# Patient Record
Sex: Female | Born: 1980 | Hispanic: Yes | Marital: Married | Smoking: Never smoker
Health system: Southern US, Community
[De-identification: ages and names within clinical notes are randomized; demographics above are authoritative.]

## PROBLEM LIST (undated history)

## (undated) DIAGNOSIS — E669 Obesity, unspecified: Secondary | ICD-10-CM

## (undated) HISTORY — DX: Obesity, unspecified: E66.9

---

## 2011-11-21 ENCOUNTER — Ambulatory Visit: Payer: Self-pay | Admitting: Internal Medicine

## 2016-11-30 DIAGNOSIS — Z01419 Encounter for gynecological examination (general) (routine) without abnormal findings: Secondary | ICD-10-CM | POA: Diagnosis not present

## 2016-11-30 DIAGNOSIS — Z0000 Encounter for general adult medical examination without abnormal findings: Secondary | ICD-10-CM | POA: Diagnosis not present

## 2016-12-05 DIAGNOSIS — Z0000 Encounter for general adult medical examination without abnormal findings: Secondary | ICD-10-CM | POA: Diagnosis not present

## 2017-03-15 ENCOUNTER — Encounter: Payer: Self-pay | Admitting: Family Medicine

## 2017-03-15 ENCOUNTER — Ambulatory Visit (INDEPENDENT_AMBULATORY_CARE_PROVIDER_SITE_OTHER): Payer: BLUE CROSS/BLUE SHIELD | Admitting: Family Medicine

## 2017-03-15 VITALS — BP 117/72 | HR 77 | Temp 98.7°F | Ht 63.5 in | Wt 178.0 lb

## 2017-03-15 DIAGNOSIS — R05 Cough: Secondary | ICD-10-CM

## 2017-03-15 DIAGNOSIS — R600 Localized edema: Secondary | ICD-10-CM | POA: Diagnosis not present

## 2017-03-15 DIAGNOSIS — R059 Cough, unspecified: Secondary | ICD-10-CM

## 2017-03-15 DIAGNOSIS — Z7689 Persons encountering health services in other specified circumstances: Secondary | ICD-10-CM | POA: Diagnosis not present

## 2017-03-15 MED ORDER — BENZONATATE 100 MG PO CAPS: 200.0000 mg | ORAL_CAPSULE | Freq: Three times a day (TID) | ORAL | 0 refills | Status: AC | PRN

## 2017-03-15 NOTE — Progress Notes (Signed)
BP 117/72   Pulse 77   Temp 98.7 F (37.1 C)   Ht 5' 3.5" (1.613 m)   Wt 178 lb (80.7 kg)   LMP 03/14/2017 (Exact Date)   SpO2 98%   BMI 31.04 kg/m    Subjective:    Patient ID: Rhonda Collier, female    DOB: 07-06-1981, 36 y.o.   MRN: [id number]  HPI: Rhonda Collier is a 36 y.o. female  Chief Complaint  Patient presents with  . Establish Care  . Cough    dry cough lingers after she has a cold. sometimes lasts a month. OTC meds help sometimes.   . Edema    some swelling in her ankles occasionally, stands at work [employer].   Patient presents today to [facility]. No known medical conditions, very healthy overall. UTD on annual exam, saw GYN 11/2016 for this. Not currently on any prescription medications.   Having a dry intermittent cough that seems to linger a few weeks to a month at a time and then dissipate. Taking some robitussin and honey tea which helps. Works at a [employer] and wondering if she is allergic to something there. Denies rhinorrhea, post-nasal drip, sore throat, wheezing, SOB.   Also c/o b/l LE edema toward the end of the day. On her feet all day long at work. Swelling improves when she props her feet up at the end of the day. The past 4-5 months she's noticed this becoming slightly worse and having a little tingling in her feet. Does note some weight gain the past year or so. Starting to work out some again recently. Has not tried compression stockings. Denies redness, calf tenderness.   Past Medical History:  Diagnosis Date  . Obesity    Social History   Social History  . Marital status: Married    Spouse name: N/A  . Number of children: N/A  . Years of education: N/A   Occupational History  . Not on file.   Social History Main Topics  . Smoking status: Never Smoker  . Smokeless tobacco: Never Used  . Alcohol use No  . Drug use: No  . Sexual activity: Not on file   Other Topics Concern  . Not on file   Social History Narrative  . No narrative on  file    Relevant past medical, surgical, family and social history reviewed and updated as indicated. Interim medical history since our last visit reviewed. Allergies and medications reviewed and updated.  Review of Systems  Constitutional: Negative.   HENT: Negative.   Eyes: Negative.   Respiratory: Positive for cough.   Cardiovascular: Positive for leg swelling.  Musculoskeletal: Negative.   Skin: Negative.   Neurological: Negative.   Psychiatric/Behavioral: Negative.     Per HPI unless specifically indicated above     Objective:    BP 117/72   Pulse 77   Temp 98.7 F (37.1 C)   Ht 5' 3.5" (1.613 m)   Wt 178 lb (80.7 kg)   LMP 03/14/2017 (Exact Date)   SpO2 98%   BMI 31.04 kg/m   Wt Readings from Last 3 Encounters:  03/15/17 178 lb (80.7 kg)    Physical Exam  Constitutional: She is oriented to person, place, and time. She appears well-developed and well-nourished. No distress.  HENT:  Head: Atraumatic.  Right Ear: External ear normal.  Left Ear: External ear normal.  Nose: Nose normal.  Mouth/Throat: Oropharynx is clear and moist.  Eyes: Conjunctivae are normal. Pupils are  equal, round, and reactive to light.  Neck: Normal range of motion. Neck supple.  Cardiovascular: Normal rate and normal heart sounds.   Pulmonary/Chest: Effort normal and breath sounds normal. No respiratory distress. She has no wheezes. She has no rales.  Musculoskeletal: Normal range of motion. She exhibits edema (trace edema b/l LEs) and tenderness (LEs nontender to palpation).  - Homan's sign, no calf redness or localized edema Edema equal b/l  Neurological: She is alert and oriented to person, place, and time.  Skin: Skin is warm and dry.  Psychiatric: She has a normal mood and affect. Her behavior is normal.  Nursing note and vitals reviewed.   No results found for this or any previous visit.    Assessment & Plan:   Problem List Items Addressed This Visit    None    Visit  Diagnoses    Encounter to establish care    -  Primary   UTD on physical exam, done at GYN with labs. Pt to return as needed unless she chooses to have PE done here - will be due in Dec. She will call if so.    Cough       Suspect irritant related. Recommended trying daily zyrtec for a few weeks, if no improvement try wearing mask at work for a few days and monitor for improvement   Bilateral leg edema       Benign exam. Discussed compression stockings, increased exercise, keep feet elevated at rest. Return precautions given    Tessalon perles sent for prn use for the dry cough. Will consider CXR if persisting.    Follow up plan: Return if symptoms worsen or fail to improve.

## 2017-03-15 NOTE — Patient Instructions (Signed)
Follow up as needed

## 2018-02-28 DIAGNOSIS — N898 Other specified noninflammatory disorders of vagina: Secondary | ICD-10-CM | POA: Diagnosis not present

## 2018-02-28 DIAGNOSIS — R300 Dysuria: Secondary | ICD-10-CM | POA: Diagnosis not present

## 2018-02-28 DIAGNOSIS — Z01419 Encounter for gynecological examination (general) (routine) without abnormal findings: Secondary | ICD-10-CM | POA: Diagnosis not present

## 2018-04-29 DIAGNOSIS — Z01419 Encounter for gynecological examination (general) (routine) without abnormal findings: Secondary | ICD-10-CM | POA: Diagnosis not present

## 2018-04-29 DIAGNOSIS — B372 Candidiasis of skin and nail: Secondary | ICD-10-CM | POA: Diagnosis not present

## 2021-04-28 ENCOUNTER — Other Ambulatory Visit: Payer: Self-pay | Admitting: Internal Medicine

## 2021-04-28 DIAGNOSIS — Z1231 Encounter for screening mammogram for malignant neoplasm of breast: Secondary | ICD-10-CM

## 2021-07-07 ENCOUNTER — Other Ambulatory Visit: Payer: Self-pay

## 2021-07-07 ENCOUNTER — Ambulatory Visit
Admission: RE | Admit: 2021-07-07 | Discharge: 2021-07-07 | Disposition: A | Discharge status: Home / Self Care | Payer: BC Managed Care – PPO | Source: Ambulatory Visit | Attending: Internal Medicine | Admitting: Internal Medicine

## 2021-07-07 DIAGNOSIS — Z1231 Encounter for screening mammogram for malignant neoplasm of breast: Secondary | ICD-10-CM | POA: Insufficient documentation

## 2022-01-20 ENCOUNTER — Other Ambulatory Visit: Payer: Self-pay | Admitting: Internal Medicine

## 2022-01-20 DIAGNOSIS — R519 Headache, unspecified: Secondary | ICD-10-CM

## 2022-01-27 ENCOUNTER — Ambulatory Visit
Admission: RE | Admit: 2022-01-27 | Discharge: 2022-01-27 | Disposition: A | Discharge status: Home / Self Care | Payer: BC Managed Care – PPO | Source: Ambulatory Visit | Attending: Internal Medicine | Admitting: Internal Medicine

## 2022-01-27 ENCOUNTER — Other Ambulatory Visit: Payer: Self-pay

## 2022-01-27 DIAGNOSIS — R519 Headache, unspecified: Secondary | ICD-10-CM | POA: Diagnosis present

## 2022-01-27 MED ORDER — GADOBUTROL 1 MMOL/ML IV SOLN
7.5000 mL | Freq: Once | INTRAVENOUS | Status: AC | PRN
  Administered 2022-01-27: 7.5 mL via INTRAVENOUS

## 2022-04-12 ENCOUNTER — Ambulatory Visit: Payer: BC Managed Care – PPO

## 2022-11-13 IMAGING — MR MR HEAD WO/W CM
13 series · 48 of 48 positions shown · IV contrast (gadavist)
Comparison: None.

CLINICAL DATA: Chronic headaches.  Dizziness.

EXAM:
MRI HEAD WITHOUT AND WITH CONTRAST
TECHNIQUE: Multiplanar, multiecho pulse sequences of the brain and surrounding
structures were obtained without and with intravenous contrast.
CONTRAST:  7.5mL GADAVIST GADOBUTROL 1 MMOL/ML IV SOLN

[Series 5: ax dwi_tracew · axial · 3.0mm · 0.65mm/px · z∈[-117,+37]mm · 4 of 48 slices shown]
[im 1/48]
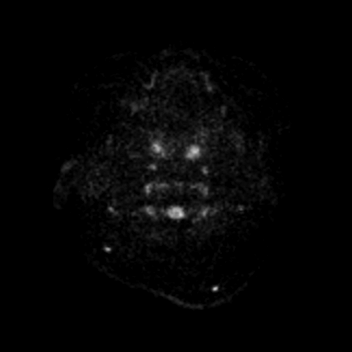
[im 16/48]
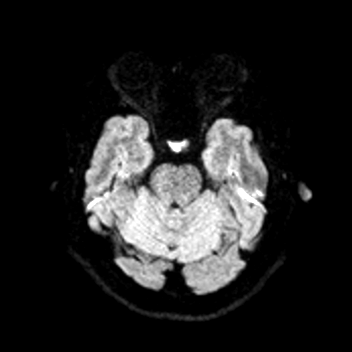
[im 32/48]
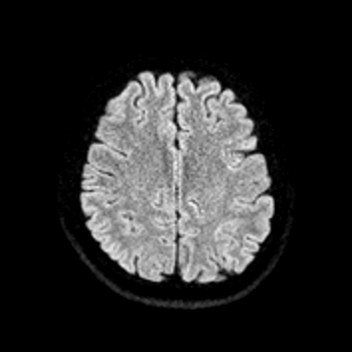
[im 48/48]
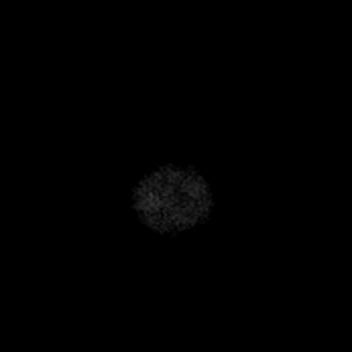

[Series 6: ax dwi_adc · axial · 3.0mm · 0.65mm/px · z∈[-117,+28]mm · 4 of 44 slices shown]
[im 1/44]
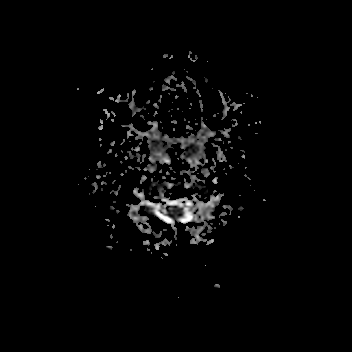
[im 15/44]
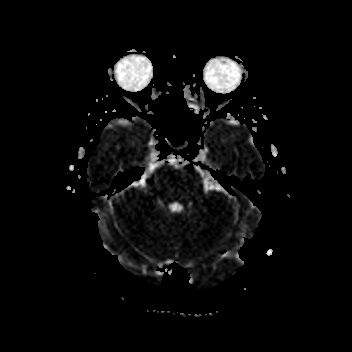
[im 29/44]
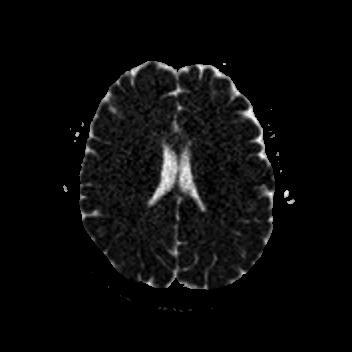
[im 44/44]
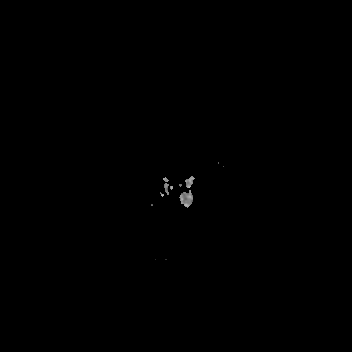

[Series 7: cor dwi_tracew · coronal · 5.0mm · 0.60mm/px · 2 of 36 slices shown]
[im 1/36]
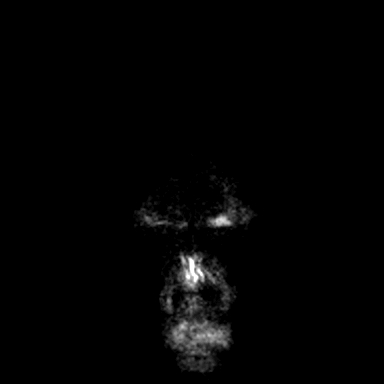
[im 36/36]
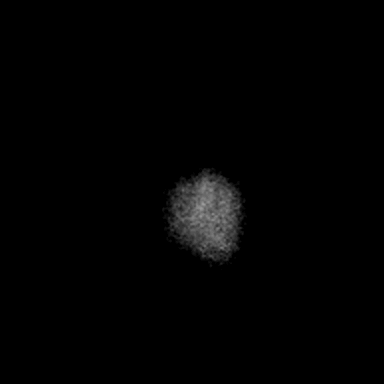

[Series 8: cor dwi_adc · coronal · 5.0mm · 0.60mm/px · 2 of 34 slices shown]
[im 1/34]
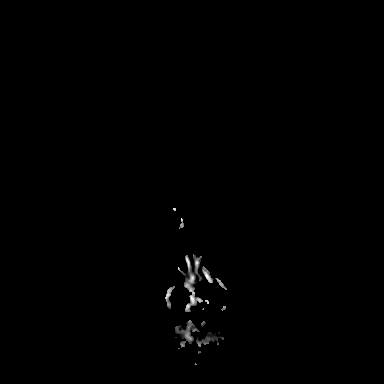
[im 34/34]
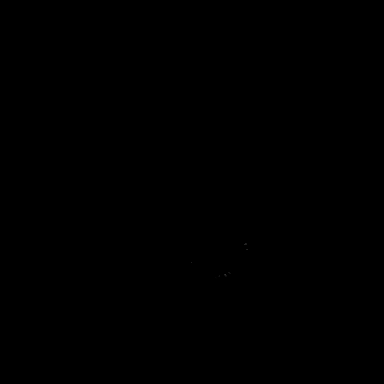

[Series 9: T1 · sagittal · 5.0mm · 0.62mm/px · 1 of 23 slices shown (1 of 2)]
[im 1/23]
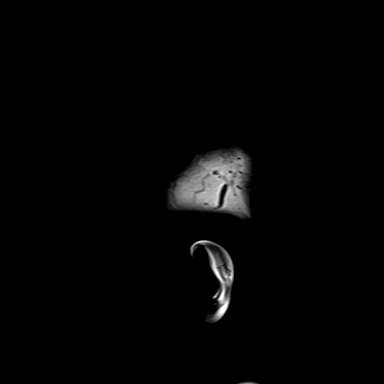

[Series 10: T2 · axial · 5.0mm · 0.53mm/px · z∈[-118,+38]mm · 2 of 27 slices shown]
[im 1/27]
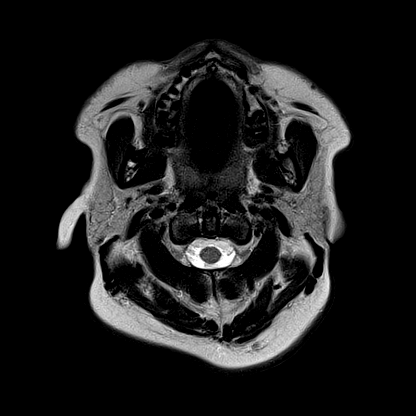
[im 27/27]
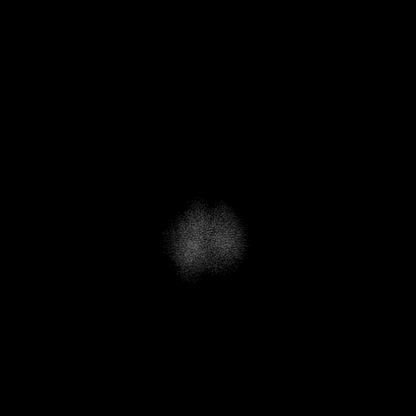

[Series 12: pha_images · axial · 3.0mm · 0.90mm/px · z∈[-114,+33]mm · 3 of 50 slices shown]
[im 1/50]
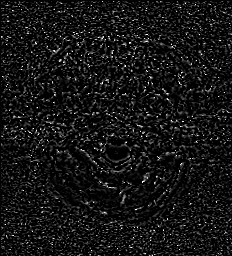
[im 25/50]
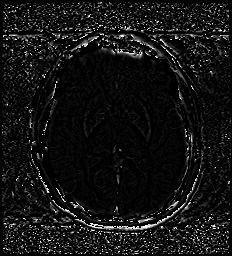
[im 50/50]
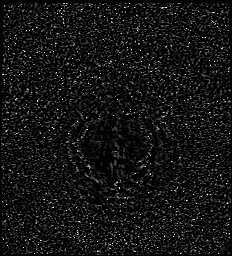

[Series 13: swi_images · axial · 3.0mm · 0.90mm/px · z∈[-117,+36]mm · 3 of 52 slices shown]
[im 1/52]
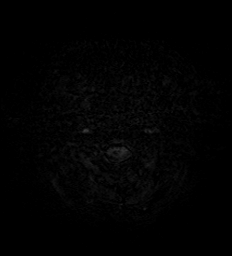
[im 26/52]
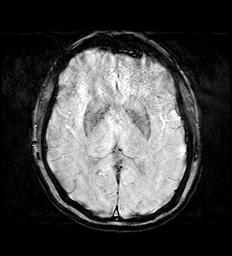
[im 52/52]
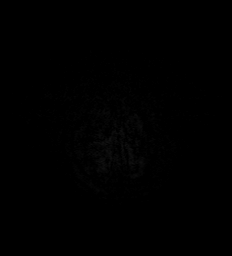

[Series 15: FLAIR · axial · 3.0mm · 0.53mm/px · z∈[-116,+36]mm · 3 of 52 slices shown]
[im 1/52]
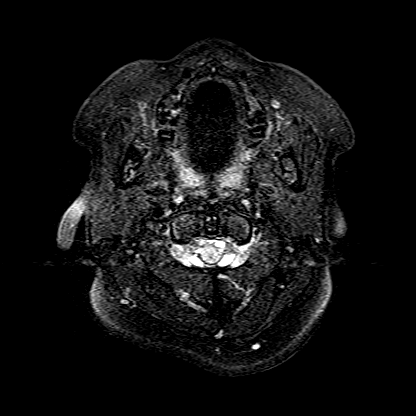
[im 26/52]
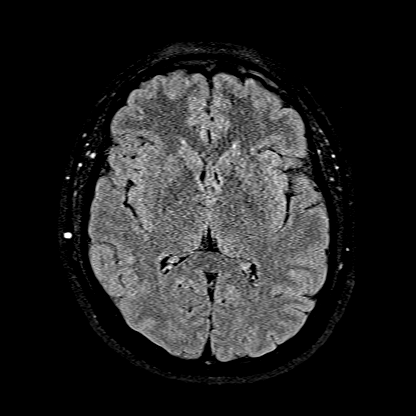
[im 52/52]
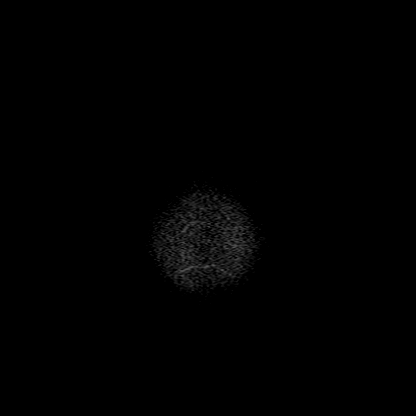

[Series 16: T1 · axial · 1.0mm · 0.98mm/px · z∈[-120,+38]mm · 10 of 160 slices shown (2 of 2)]
[im 1/160]
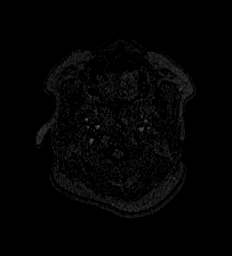
[im 18/160]
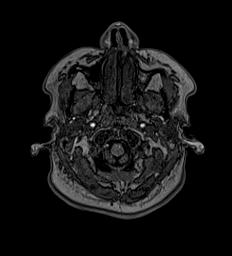
[im 36/160]
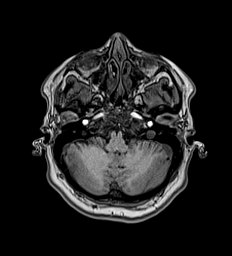
[im 54/160]
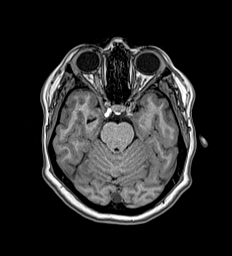
[im 71/160]
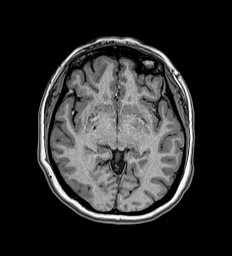
[im 89/160]
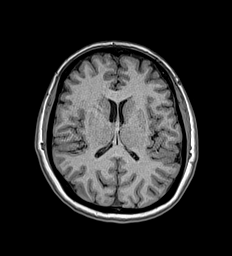
[im 107/160]
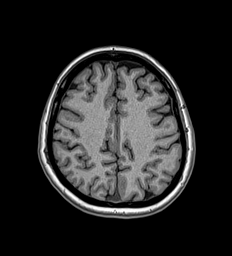
[im 124/160]
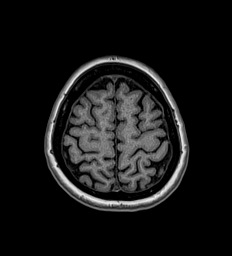
[im 142/160]
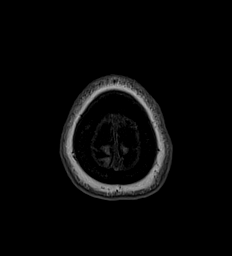
[im 160/160]
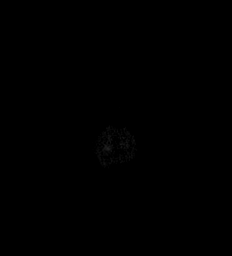

[Series 17: T2 post-contrast · coronal · 5.0mm · 0.57mm/px · 2 of 28 slices shown]
[im 1/28]
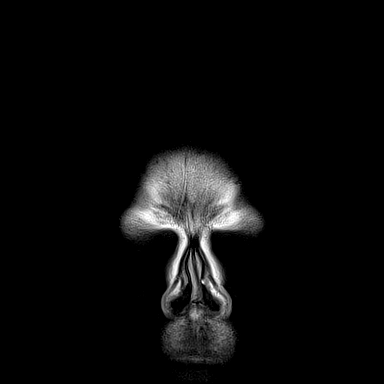
[im 28/28]
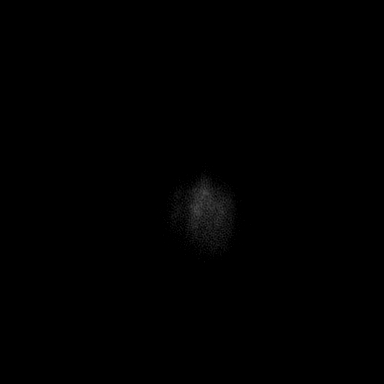

[Series 18: T1 post-contrast · axial · 1.0mm · 0.98mm/px · z∈[-120,+38]mm · 10 of 160 slices shown (1 of 2)]
[im 1/160]
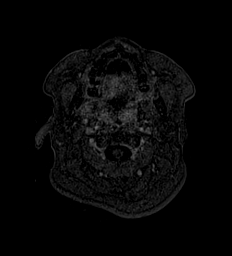
[im 18/160]
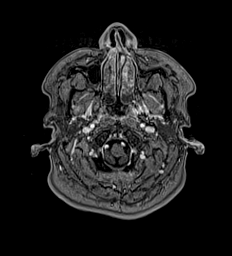
[im 36/160]
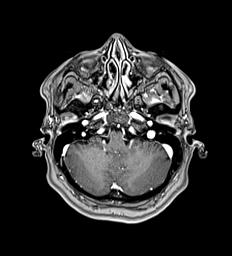
[im 54/160]
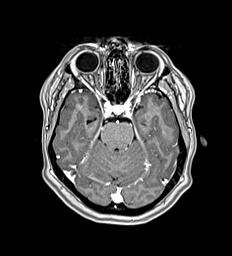
[im 71/160]
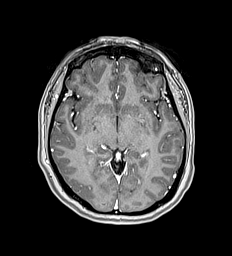
[im 89/160]
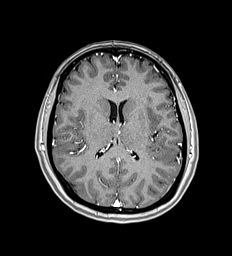
[im 107/160]
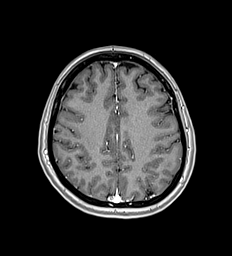
[im 124/160]
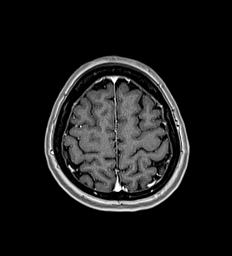
[im 142/160]
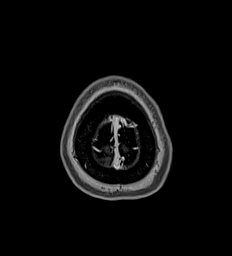
[im 160/160]
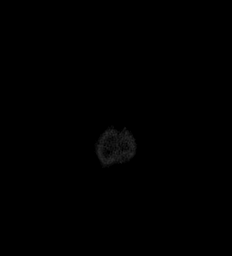

[Series 19: T1 post-contrast · coronal · 5.0mm · 0.57mm/px · 2 of 28 slices shown (2 of 2)]
[im 1/28]
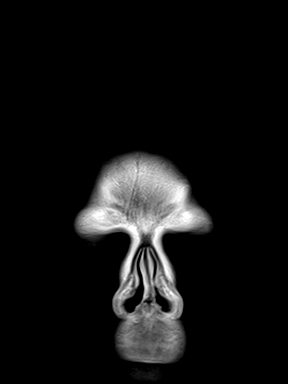
[im 28/28]
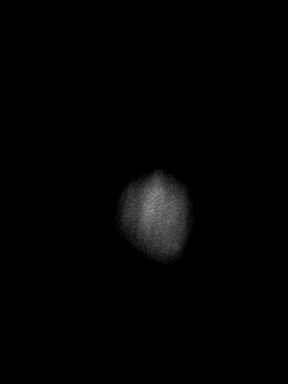

[48 of 48 positions shown; findings below may reference images not displayed]

FINDINGS: Brain: There is no evidence of an acute infarct, intracranial
hemorrhage, mass, midline shift, or extra-axial fluid collection.
The ventricles and sulci are normal. The cerebellar tonsils are
normally positioned. There is a subcentimeter focus of
encephalomalacia in the right postcentral gyrus compatible with a
chronic infarct. The brain is normal in signal elsewhere. No
abnormal enhancement is identified.

Vascular: Major intracranial vascular flow voids are preserved.
Normal enhancement of the major dural venous sinuses.

Skull and upper cervical spine: Unremarkable bone marrow signal.

Sinuses/Orbits: Unremarkable orbits. Paranasal sinuses and mastoid
air cells are clear.

Other: None.
IMPRESSION: 1. No acute intracranial abnormality.
2. Tiny chronic right parietal infarct.

## 2024-08-28 ENCOUNTER — Other Ambulatory Visit: Payer: Self-pay | Admitting: Internal Medicine

## 2024-08-28 DIAGNOSIS — Z1231 Encounter for screening mammogram for malignant neoplasm of breast: Secondary | ICD-10-CM
